# Patient Record
Sex: Female | Born: 1980 | Race: White | Hispanic: No | Marital: Single | Smoking: Former smoker
Health system: Southern US, Community
[De-identification: ages and names within clinical notes are randomized; demographics above are authoritative.]

## PROBLEM LIST (undated history)

## (undated) DIAGNOSIS — J45909 Unspecified asthma, uncomplicated: Secondary | ICD-10-CM

## (undated) DIAGNOSIS — T7840XA Allergy, unspecified, initial encounter: Secondary | ICD-10-CM

## (undated) DIAGNOSIS — I341 Nonrheumatic mitral (valve) prolapse: Secondary | ICD-10-CM

## (undated) HISTORY — DX: Unspecified asthma, uncomplicated: J45.909

## (undated) HISTORY — DX: Allergy, unspecified, initial encounter: T78.40XA

---
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
## 2005-05-19 ENCOUNTER — Other Ambulatory Visit: Admission: RE | Admit: 2005-05-19 | Discharge: 2005-05-19 | Payer: Self-pay | Admitting: Obstetrics and Gynecology

## 2006-11-21 ENCOUNTER — Inpatient Hospital Stay (HOSPITAL_COMMUNITY): Admission: AD | Admit: 2006-11-21 | Discharge: 2006-11-24 | Payer: Self-pay | Admitting: Obstetrics and Gynecology

## 2010-01-04 ENCOUNTER — Emergency Department (HOSPITAL_COMMUNITY)
Admission: EM | Admit: 2010-01-04 | Discharge: 2010-01-04 | Payer: Self-pay | Source: Home / Self Care | Admitting: Family Medicine

## 2010-04-07 LAB — POCT URINALYSIS DIPSTICK
Nitrite: NEGATIVE
Protein, ur: NEGATIVE mg/dL
Specific Gravity, Urine: 1.02 (ref 1.005–1.030)
Urobilinogen, UA: 1 mg/dL (ref 0.0–1.0)
pH: 6 (ref 5.0–8.0)

## 2010-04-07 LAB — COMPREHENSIVE METABOLIC PANEL
ALT: 11 U/L (ref 0–35)
AST: 18 U/L (ref 0–37)
Albumin: 3.6 g/dL (ref 3.5–5.2)
Alkaline Phosphatase: 46 U/L (ref 39–117)
BUN: 9 mg/dL (ref 6–23)
CO2: 25 mEq/L (ref 19–32)
Calcium: 9.4 mg/dL (ref 8.4–10.5)
Chloride: 109 mEq/L (ref 96–112)
Creatinine, Ser: 0.72 mg/dL (ref 0.4–1.2)
GFR calc Af Amer: >60 mL/min (ref >60)
GFR calc non Af Amer: >60 mL/min (ref >60)
Glucose, Bld: 114 mg/dL — ABNORMAL HIGH (ref 70–99)
Potassium: 3.5 mEq/L (ref 3.5–5.1)
Sodium: 141 mEq/L (ref 135–145)
Total Bilirubin: 1 mg/dL (ref 0.3–1.2)
Total Protein: 6.5 g/dL (ref 6.0–8.3)

## 2010-04-07 LAB — DIFFERENTIAL
Basophils Absolute: 0 10*3/uL (ref 0.0–0.1)
Basophils Relative: 0 % (ref 0–1)
Eosinophils Absolute: 0.3 10*3/uL (ref 0.0–0.7)
Eosinophils Relative: 6 % — ABNORMAL HIGH (ref 0–5)
Lymphocytes Relative: 24 % (ref 12–46)
Lymphs Abs: 1.4 10*3/uL (ref 0.7–4.0)
Monocytes Absolute: 0.5 10*3/uL (ref 0.1–1.0)
Monocytes Relative: 9 % (ref 3–12)
Neutro Abs: 3.5 10*3/uL (ref 1.7–7.7)
Neutrophils Relative %: 61 % (ref 43–77)

## 2010-04-07 LAB — CBC
HCT: 38.9 % (ref 36.0–46.0)
Hemoglobin: 12.7 g/dL (ref 12.0–15.0)
MCH: 29.6 pg (ref 26.0–34.0)
MCHC: 32.6 g/dL (ref 30.0–36.0)
MCV: 90.7 fL (ref 78.0–100.0)
Platelets: 264 10*3/uL (ref 150–400)
RBC: 4.29 MIL/uL (ref 3.87–5.11)
RDW: 12.3 % (ref 11.5–15.5)
WBC: 5.6 10*3/uL (ref 4.0–10.5)

## 2010-04-07 LAB — SEDIMENTATION RATE: Sed Rate: 6 mm/hr (ref 0–22)

## 2010-04-07 LAB — TSH: TSH: 1.385 u[IU]/mL (ref 0.350–4.500)

## 2010-05-10 ENCOUNTER — Inpatient Hospital Stay (INDEPENDENT_AMBULATORY_CARE_PROVIDER_SITE_OTHER)
Admission: RE | Admit: 2010-05-10 | Discharge: 2010-05-10 | Disposition: A | Discharge status: Home / Self Care | Payer: 59 | Source: Ambulatory Visit | Attending: Family Medicine | Admitting: Family Medicine

## 2010-05-10 DIAGNOSIS — S61209A Unspecified open wound of unspecified finger without damage to nail, initial encounter: Secondary | ICD-10-CM

## 2010-06-09 NOTE — H&P (Signed)
NAMEShauntee, [name]                   ACCOUNT NO.:  [id number]   MEDICAL RECORD NO.:  [id number]          PATIENT TYPE:  INP   LOCATION:  9166                          FACILITY:  [facility]   PHYSICIAN:  Lenoard Aden, M.D.DATE OF BIRTH:  07-07-1980   DATE OF ADMISSION:  11/21/2006  DATE OF DISCHARGE:                              HISTORY & PHYSICAL   CHIEF COMPLAINT:  Oligohydramnios.   HISTORY OF PRESENT ILLNESS:  She is a 30 year old white female G1, P0 at  [redacted] weeks gestation with an AFI of 4.5 today for induction.   ALLERGIES:  No known drug allergies.   MEDICATIONS:  Prenatal vitamins.   FAMILY HISTORY:  She has a family history of hypertension, heart  disease, anemia, kidney stones, cervical cancer, congenital anomalies.   PAST PERSONAL HISTORY:  She has a personal history of wisdom tooth  extraction, mitral valve prolapse without premedication, childhood  asthma.   SOCIAL HISTORY:  She is a reformed smoker.  She denies domestic or  physical violence.   OBSTETRICAL HISTORY:  Her pregnancy course to date has otherwise been  uncomplicated.   PHYSICAL EXAMINATION:  GENERAL APPEARANCE:  She is a well-developed,  well-nourished white female in no acute distress.  HEENT: Normal.  VITAL SIGNS:  Blood pressure 102/72.  Weight 145 pounds.  LUNGS:  Clear.  HEART:  Regular rate and rhythm.  ABDOMEN:  Soft, gravid, nontender.  Estimated fetal weight 7.5 pounds.  PELVIC:  Cervix is 2 cm, 70%, vertex, 0 station.  EXTREMITIES:  Reveal no cords.  NEUROLOGICAL:  Nonfocal.  SKIN:  Intact.   NST is reactive.  Cervidil is placed.   IMPRESSION:  1. 40 week OB.  2. Oligohydramnios for induction.   PLAN:  Proceed with cervical ripening induction, anticipate attempt at  vaginal delivery.      Lenoard Aden, M.D.  Electronically Signed     RJT/MEDQ  D:  11/21/2006  T:  11/22/2006  Job:  [id number]

## 2010-11-04 LAB — CBC
HCT: 32.8 — ABNORMAL LOW
MCHC: 34.6
MCV: 89.9
Platelets: 199
Platelets: 206
RBC: 3.92
RDW: 12.9
RDW: 13.2
WBC: 9.6

## 2010-11-04 LAB — RPR: RPR Ser Ql: NONREACTIVE

## 2010-12-11 ENCOUNTER — Emergency Department (HOSPITAL_COMMUNITY)
Admission: EM | Admit: 2010-12-11 | Discharge: 2010-12-11 | Disposition: A | Discharge status: Home / Self Care | Payer: Managed Care, Other (non HMO) | Attending: Emergency Medicine | Admitting: Emergency Medicine

## 2010-12-11 ENCOUNTER — Emergency Department (HOSPITAL_COMMUNITY): Payer: Managed Care, Other (non HMO)

## 2010-12-11 DIAGNOSIS — I059 Rheumatic mitral valve disease, unspecified: Secondary | ICD-10-CM | POA: Insufficient documentation

## 2010-12-11 DIAGNOSIS — N201 Calculus of ureter: Secondary | ICD-10-CM

## 2010-12-11 DIAGNOSIS — R109 Unspecified abdominal pain: Secondary | ICD-10-CM | POA: Insufficient documentation

## 2010-12-11 DIAGNOSIS — F17200 Nicotine dependence, unspecified, uncomplicated: Secondary | ICD-10-CM | POA: Insufficient documentation

## 2010-12-11 DIAGNOSIS — R110 Nausea: Secondary | ICD-10-CM | POA: Insufficient documentation

## 2010-12-11 HISTORY — DX: Nonrheumatic mitral (valve) prolapse: I34.1

## 2010-12-11 LAB — URINALYSIS, ROUTINE W REFLEX MICROSCOPIC
Bilirubin Urine: NEGATIVE
Leukocytes, UA: NEGATIVE
Nitrite: NEGATIVE
Specific Gravity, Urine: >1.03 — ABNORMAL HIGH (ref 1.005–1.030)
Urobilinogen, UA: 0.2 mg/dL (ref 0.0–1.0)
pH: 5.5 (ref 5.0–8.0)

## 2010-12-11 LAB — POCT I-STAT, CHEM 8
BUN: 9 mg/dL (ref 6–23)
Calcium, Ion: 1.18 mmol/L (ref 1.12–1.32)
Chloride: 103 mEq/L (ref 96–112)
Creatinine, Ser: 0.7 mg/dL (ref 0.50–1.10)
Sodium: 141 mEq/L (ref 135–145)

## 2010-12-11 LAB — URINE MICROSCOPIC-ADD ON

## 2010-12-11 MED ORDER — OXYCODONE-ACETAMINOPHEN 5-325 MG PO TABS: 1.0000 | ORAL_TABLET | Freq: Four times a day (QID) | ORAL | Status: AC | PRN

## 2010-12-11 MED ORDER — ONDANSETRON HCL 4 MG/2ML IJ SOLN
4.0000 mg | Freq: Once | INTRAMUSCULAR | Status: AC
  Administered 2010-12-11: 4 mg via INTRAVENOUS
  Filled 2010-12-11: qty 2

## 2010-12-11 MED ORDER — FENTANYL CITRATE 0.05 MG/ML IJ SOLN
50.0000 ug | Freq: Once | INTRAMUSCULAR | Status: AC
  Administered 2010-12-11: 50 ug via INTRAVENOUS
  Filled 2010-12-11: qty 2

## 2010-12-11 NOTE — ED Notes (Signed)
Pt given 2 warm blankets

## 2010-12-11 NOTE — ED Notes (Signed)
Patient presents with right flank pain with urinary frequency and urgency x3 days with intermittent vomiting.

## 2010-12-11 NOTE — ED Provider Notes (Signed)
History     CSN: [id number] Arrival date & time: 12/11/2010 10:51 AM   First MD Initiated Contact with Patient 12/11/10 1222      Chief Complaint  Patient presents with  . Flank Pain    (Consider location/radiation/quality/duration/timing/severity/associated sxs/prior treatment) HPI Presents with symptoms of urinary frequency as well as right-sided flank pain. She states that the symptoms began earlier today. The flank pain was acute in onset. It lasted several hours and was described as sharp and stabbing. At the time of my evaluation and the pain resolves. She denies any vaginal bleeding or vaginal discharge. She did feel nauseated but had no vomiting. She denies any fever or chills. She has not tried anything to make her symptoms better. There are no other associated symptoms.  Past Medical History  Diagnosis Date  . Mitral valve prolapse     History reviewed. No pertinent past surgical history.  History reviewed. No pertinent family history.  History  Substance Use Topics  . Smoking status: Current Everyday Smoker  . Smokeless tobacco: Not on file  . Alcohol Use: Yes    OB History    Grav Para Term Preterm Abortions TAB SAB Ect Mult Living                  Review of Systems ROS reviewed and otherwise negative except for mentioned in HPI  Allergies  Review of patient's allergies indicates no known allergies.  Home Medications   Current Outpatient Rx  Name Route Sig Dispense Refill  . ETONOGESTREL-ETHINYL ESTRADIOL 0.12-0.015 MG/24HR VA RING Vaginal Place 1 each vaginally every 28 (twenty-eight) days. Insert vaginally and leave in place for 3 consecutive weeks, then remove for 1 week.     . OXYCODONE-ACETAMINOPHEN 5-325 MG PO TABS Oral Take 1-2 tablets by mouth every 6 (six) hours as needed for pain. 15 tablet 0    BP 102/73  Pulse 90  Temp(Src) 97.5 F (36.4 C) (Oral)  Resp 16  Ht 5\' 7"  (1.702 m)  Wt 110 lb (49.896 kg)  BMI 17.23 kg/m2  SpO2 97%  LMP  11/30/2010 Vitals reviewed Physical Exam Physical Examination: General appearance - alert, well appearing, and in no distress Mental status - alert, oriented to person, place, and time Mouth - mucous membranes moist, pharynx normal without lesions Chest - clear to auscultation, no wheezes, rales or rhonchi, symmetric air entry Heart - normal rate, regular rhythm, normal S1, S2, no murmurs, rubs, clicks or gallops Abdomen - soft, nontender, nondistended, no masses or organomegaly Back exam - no CVA tenderness  Musculoskeletal - no joint tenderness, deformity or swelling Extremities - peripheral pulses normal, no pedal edema, no clubbing or cyanosis Skin - normal coloration and turgor, no rashes, no suspicious skin lesions noted  ED Course  Procedures (including critical care time)  Labs Reviewed  URINALYSIS, ROUTINE W REFLEX MICROSCOPIC - Abnormal; Notable for the following:    Appearance CLOUDY (*)    Specific Gravity, Urine >1.030 (*)    Hgb urine dipstick LARGE (*)    All other components within normal limits  URINE MICROSCOPIC-ADD ON - Abnormal; Notable for the following:    Squamous Epithelial / LPF MANY (*)    Bacteria, UA FEW (*)    All other components within normal limits  POCT I-STAT, CHEM 8 - Abnormal; Notable for the following:    Hemoglobin 15.3 (*)    All other components within normal limits  POCT PREGNANCY, URINE  POCT PREGNANCY, URINE  URINALYSIS, ROUTINE  W REFLEX MICROSCOPIC  PREGNANCY, URINE  I-STAT, CHEM 8   Ct Abdomen Pelvis Wo Contrast  12/11/2010  *RADIOLOGY REPORT*  Clinical Data: Right flank pain.  Hematuria.  CT ABDOMEN AND PELVIS WITHOUT CONTRAST 12/11/2010:  Technique:  Multidetector CT imaging of the abdomen and pelvis was performed following the standard protocol without intravenous contrast.  Comparison: None.  Findings: Approximate 3 mm calcification in the low   right side of the pelvis which I believe is in the distal right ureter.  Mild  hydronephrosis involving the right kidney relative to the left.  Non-obstructing approximate 3 mm calculus in an upper pole calix of the left kidney.  Within the limits of the unenhanced technique, no focal parenchymal abnormality involving either kidney.  Anatomic variants involving the liver in that there is a prominent Reidel lobe and the left lobe extends well across the midline into the left upper quadrant.  Borderline hepatomegaly.  Normal unenhanced appearance of the spleen, pancreas, and adrenal glands. Gallbladder unremarkable by CT.  No biliary ductal dilation.  No visible atherosclerosis.  No significant lymphadenopathy.  Stomach decompressed and unremarkable by CT.  Normal-appearing small bowel.  Large amount of stool in the rectum, sigmoid colon, and cecum; no focal abnormality involving the colon.  Appendix normal in caliber, containing 2 adjacent small appendicoliths distally (series 2, image 43).  No ascites.  Normal unenhanced appearance of the uterus and ovaries.  Small bilateral ovarian cysts consistent with age.  No dominant cysts. No free pelvic fluid.  Urinary bladder unremarkable.  Bone window images unremarkable.  Visualized lung bases clear.  Heart size normal.  IMPRESSION:  1.  3 mm calculus in the distal right ureter near the UVJ, causing mild right urinary tract obstruction. 2.  Non-obstructing approximate 4 mm calculus in the upper pole calix of the left kidney. 3.  Borderline hepatomegaly.  Original Report Authenticated By: Arnell Sieving, M.D.     1. Right ureteral stone       MDM  Patient presented with right-sided flank pain as well as urinary frequency. Her urine was positive for blood. Her CT scan showed a 3 mm stone at BU feet J. Her pain was not present at the time of my evaluation however she did have recurrence of flank pain during her ED stay. This was treated with fentanyl and is now much improved. She was discharged with strict return precautions and advised to  followup with urology. She is agreeable with this plan        Ethelda Chick, MD 12/11/10 [phone]

## 2012-06-14 ENCOUNTER — Ambulatory Visit (INDEPENDENT_AMBULATORY_CARE_PROVIDER_SITE_OTHER): Payer: Managed Care, Other (non HMO) | Admitting: Family Medicine

## 2012-06-14 VITALS — BP 150/90 | HR 116 | Temp 99.3°F | Resp 16 | Ht 66.5 in | Wt 114.0 lb

## 2012-06-14 DIAGNOSIS — R05 Cough: Secondary | ICD-10-CM

## 2012-06-14 DIAGNOSIS — J209 Acute bronchitis, unspecified: Secondary | ICD-10-CM

## 2012-06-14 DIAGNOSIS — R0789 Other chest pain: Secondary | ICD-10-CM

## 2012-06-14 DIAGNOSIS — R059 Cough, unspecified: Secondary | ICD-10-CM

## 2012-06-14 DIAGNOSIS — R071 Chest pain on breathing: Secondary | ICD-10-CM

## 2012-06-14 MED ORDER — AZITHROMYCIN 250 MG PO TABS: ORAL_TABLET | ORAL | Status: DC

## 2012-06-14 MED ORDER — HYDROCODONE-HOMATROPINE 5-1.5 MG/5ML PO SYRP: ORAL_SOLUTION | ORAL | Status: AC

## 2012-06-14 NOTE — Patient Instructions (Signed)
Start the antibiotic, mucinex DM or prescribed cough syrup if needed. Alleve over the counter for sore muscles with cough. Keep a record of your blood pressures outside of the office and recheck if remain above 140/90. Return to the clinic or go to the nearest emergency room if any of your symptoms worsen or new symptoms occur. Bronchitis Bronchitis is the body's way of reacting to injury and/or infection (inflammation) of the bronchi. Bronchi are the air tubes that extend from the windpipe into the lungs. If the inflammation becomes severe, it may cause shortness of breath. CAUSES  Inflammation may be caused by:  A virus.  Germs (bacteria).  Dust.  Allergens.  Pollutants and many other irritants. The cells lining the bronchial tree are covered with tiny hairs (cilia). These constantly beat upward, away from the lungs, toward the mouth. This keeps the lungs free of pollutants. When these cells become too irritated and are unable to do their job, mucus begins to develop. This causes the characteristic cough of bronchitis. The cough clears the lungs when the cilia are unable to do their job. Without either of these protective mechanisms, the mucus would settle in the lungs. Then you would develop pneumonia. Smoking is a common cause of bronchitis and can contribute to pneumonia. Stopping this habit is the single most important thing you can do to help yourself. TREATMENT   Your caregiver may prescribe an antibiotic if the cough is caused by bacteria. Also, medicines that open up your airways make it easier to breathe. Your caregiver may also recommend or prescribe an expectorant. It will loosen the mucus to be coughed up. Only take over-the-counter or prescription medicines for pain, discomfort, or fever as directed by your caregiver.  Removing whatever causes the problem (smoking, for example) is critical to preventing the problem from getting worse.  Cough suppressants may be prescribed for  relief of cough symptoms.  Inhaled medicines may be prescribed to help with symptoms now and to help prevent problems from returning.  For those with recurrent (chronic) bronchitis, there may be a need for steroid medicines. SEEK IMMEDIATE MEDICAL CARE IF:   During treatment, you develop more pus-like mucus (purulent sputum).  You have a fever.  Your baby is older than 3 months with a rectal temperature of 102 F (38.9 C) or higher.  Your baby is 11 months old or younger with a rectal temperature of 100.4 F (38 C) or higher.  You become progressively more ill.  You have increased difficulty breathing, wheezing, or shortness of breath. It is necessary to seek immediate medical care if you are elderly or sick from any other disease. MAKE SURE YOU:   Understand these instructions.  Will watch your condition.  Will get help right away if you are not doing well or get worse. Document Released: 01/11/2005 Document Revised: 04/05/2011 Document Reviewed: 11/21/2007 [facility] Patient Information 2014 [place], [place].   Cough, Adult  A cough is a reflex that helps clear your throat and airways. It can help heal the body or may be a reaction to an irritated airway. A cough may only last 2 or 3 weeks (acute) or may last more than 8 weeks (chronic).  CAUSES Acute cough:  Viral or bacterial infections. Chronic cough:  Infections.  Allergies.  Asthma.  Post-nasal drip.  Smoking.  Heartburn or acid reflux.  Some medicines.  Chronic lung problems (COPD).  Cancer. SYMPTOMS   Cough.  Fever.  Chest pain.  Increased breathing rate.  High-pitched whistling  sound when breathing (wheezing).  Colored mucus that you cough up (sputum). TREATMENT   A bacterial cough may be treated with antibiotic medicine.  A viral cough must run its course and will not respond to antibiotics.  Your caregiver may recommend other treatments if you have a chronic cough. HOME CARE  INSTRUCTIONS   Only take over-the-counter or prescription medicines for pain, discomfort, or fever as directed by your caregiver. Use cough suppressants only as directed by your caregiver.  Use a cold steam vaporizer or humidifier in your bedroom or home to help loosen secretions.  Sleep in a semi-upright position if your cough is worse at night.  Rest as needed.  Stop smoking if you smoke. SEEK IMMEDIATE MEDICAL CARE IF:   You have pus in your sputum.  Your cough starts to worsen.  You cannot control your cough with suppressants and are losing sleep.  You begin coughing up blood.  You have difficulty breathing.  You develop pain which is getting worse or is uncontrolled with medicine.  You have a fever. MAKE SURE YOU:   Understand these instructions.  Will watch your condition.  Will get help right away if you are not doing well or get worse. Document Released: 07/10/2010 Document Revised: 04/05/2011 Document Reviewed: 07/10/2010 [facility] Patient Information 2014 [place], [place].

## 2012-06-14 NOTE — Progress Notes (Signed)
Subjective:    Patient ID: [name], female    DOB: 12/12/1980, 32 y.o.   MRN: [id number]  HPI [name] is a 32 y.o. female Cold symptoms for about 3 weeks, continued cough - with productive cough - brown - greenish sputum, also with initial congestion in face - improved after 2 days. Hx of allergies, asthma as child - no inhaler since elementary school. Subjective hot and cold, but has been taking ibuprofen. Not dyspneic with coughing spells.   No known sick contacts, but went on cruise April 28-May 2nd, started with cough/congestion then.   Account analyst - desk job.   Tx: mucinex DM, nasacort, claritin, ibuprofen, water,     Review of Systems  Constitutional: Positive for fever (subjective only).  HENT: Positive for congestion (prior ). Negative for sinus pressure.   Respiratory: Positive for cough and shortness of breath (with coughing fits. ). Negative for wheezing.   Musculoskeletal: Positive for back pain (mid L back with cough. ).  Skin: Negative for rash.       Objective:   Physical Exam  Vitals reviewed. Constitutional: She is oriented to person, place, and time. She appears well-developed and well-nourished. No distress.  HENT:  Head: Normocephalic and atraumatic.  Right Ear: Hearing, tympanic membrane, external ear and ear canal normal.  Left Ear: Hearing, tympanic membrane, external ear and ear canal normal.  Nose: Nose normal.  Mouth/Throat: Oropharynx is clear and moist. No oropharyngeal exudate.  Eyes: Conjunctivae and EOM are normal. Pupils are equal, round, and reactive to light.  Cardiovascular: Normal rate, regular rhythm, normal heart sounds and intact distal pulses.   No murmur heard. Pulmonary/Chest: Effort normal and breath sounds normal. No respiratory distress. She has no decreased breath sounds. She has no wheezes. She has no rhonchi. She has no rales.    Clear, normal effort. No wheeze.   Abdominal: Bowel sounds are normal.  Neurological: She is  alert and oriented to person, place, and time.  Skin: Skin is warm and dry. No rash noted.  Psychiatric: She has a normal mood and affect. Her behavior is normal.        Assessment & Plan:  [name] is a 32 y.o. female Acute bronchitis - Plan: azithromycin (ZITHROMAX) 250 MG tablet  Chest wall pain  Cough - Plan: HYDROcodone-homatropine (HYCODAN) 5-1.5 MG/5ML syrup  Suspected intial URI with secondary bronchitis.  Lungs clear and afebrile. Discussed cxr, cbc, but deferred tonight. Start z pak, cont mucinex DM, hycodan QHS to Q6h prn, and rtc precautions - if not improving in few days, consider cxr.   Chest wall pain, intercostal strain/myalgia with cough.  Sx care with alleve, rtc precautions.   Meds ordered this encounter                         . azithromycin (ZITHROMAX) 250 MG tablet    Sig: Take 2 pills by mouth on day 1, then 1 pill by mouth per day on days 2 through 5.    Dispense:  6 each    Refill:  0  . HYDROcodone-homatropine (HYCODAN) 5-1.5 MG/5ML syrup    Sig: 78m by mouth at bedtime as needed for cough.    Dispense:  120 mL    Refill:  0   Patient Instructions  Start the antibiotic, mucinex DM or prescribed cough syrup if needed. Alleve over the counter for sore muscles with cough. Keep a record of your blood pressures outside of  the office and recheck if remain above 140/90. Return to the clinic or go to the nearest emergency room if any of your symptoms worsen or new symptoms occur. Bronchitis Bronchitis is the body's way of reacting to injury and/or infection (inflammation) of the bronchi. Bronchi are the air tubes that extend from the windpipe into the lungs. If the inflammation becomes severe, it may cause shortness of breath. CAUSES  Inflammation may be caused by:  A virus.  Germs (bacteria).  Dust.  Allergens.  Pollutants and many other irritants. The cells lining the bronchial tree are covered with tiny hairs (cilia). These constantly beat  upward, away from the lungs, toward the mouth. This keeps the lungs free of pollutants. When these cells become too irritated and are unable to do their job, mucus begins to develop. This causes the characteristic cough of bronchitis. The cough clears the lungs when the cilia are unable to do their job. Without either of these protective mechanisms, the mucus would settle in the lungs. Then you would develop pneumonia. Smoking is a common cause of bronchitis and can contribute to pneumonia. Stopping this habit is the single most important thing you can do to help yourself. TREATMENT   Your caregiver may prescribe an antibiotic if the cough is caused by bacteria. Also, medicines that open up your airways make it easier to breathe. Your caregiver may also recommend or prescribe an expectorant. It will loosen the mucus to be coughed up. Only take over-the-counter or prescription medicines for pain, discomfort, or fever as directed by your caregiver.  Removing whatever causes the problem (smoking, for example) is critical to preventing the problem from getting worse.  Cough suppressants may be prescribed for relief of cough symptoms.  Inhaled medicines may be prescribed to help with symptoms now and to help prevent problems from returning.  For those with recurrent (chronic) bronchitis, there may be a need for steroid medicines. SEEK IMMEDIATE MEDICAL CARE IF:   During treatment, you develop more pus-like mucus (purulent sputum).  You have a fever.  Your baby is older than 3 months with a rectal temperature of 102 F (38.9 C) or higher.  Your baby is 42 months old or younger with a rectal temperature of 100.4 F (38 C) or higher.  You become progressively more ill.  You have increased difficulty breathing, wheezing, or shortness of breath. It is necessary to seek immediate medical care if you are elderly or sick from any other disease. MAKE SURE YOU:   Understand these instructions.  Will  watch your condition.  Will get help right away if you are not doing well or get worse. Document Released: 01/11/2005 Document Revised: 04/05/2011 Document Reviewed: 11/21/2007 [facility] Patient Information 2014 [place], [place].   Cough, Adult  A cough is a reflex that helps clear your throat and airways. It can help heal the body or may be a reaction to an irritated airway. A cough may only last 2 or 3 weeks (acute) or may last more than 8 weeks (chronic).  CAUSES Acute cough:  Viral or bacterial infections. Chronic cough:  Infections.  Allergies.  Asthma.  Post-nasal drip.  Smoking.  Heartburn or acid reflux.  Some medicines.  Chronic lung problems (COPD).  Cancer. SYMPTOMS   Cough.  Fever.  Chest pain.  Increased breathing rate.  High-pitched whistling sound when breathing (wheezing).  Colored mucus that you cough up (sputum). TREATMENT   A bacterial cough may be treated with antibiotic medicine.  A viral cough  must run its course and will not respond to antibiotics.  Your caregiver may recommend other treatments if you have a chronic cough. HOME CARE INSTRUCTIONS   Only take over-the-counter or prescription medicines for pain, discomfort, or fever as directed by your caregiver. Use cough suppressants only as directed by your caregiver.  Use a cold steam vaporizer or humidifier in your bedroom or home to help loosen secretions.  Sleep in a semi-upright position if your cough is worse at night.  Rest as needed.  Stop smoking if you smoke. SEEK IMMEDIATE MEDICAL CARE IF:   You have pus in your sputum.  Your cough starts to worsen.  You cannot control your cough with suppressants and are losing sleep.  You begin coughing up blood.  You have difficulty breathing.  You develop pain which is getting worse or is uncontrolled with medicine.  You have a fever. MAKE SURE YOU:   Understand these instructions.  Will watch your condition.  Will  get help right away if you are not doing well or get worse. Document Released: 07/10/2010 Document Revised: 04/05/2011 Document Reviewed: 07/10/2010 [facility] Patient Information 2014 [place], [place].

## 2012-06-15 ENCOUNTER — Ambulatory Visit (INDEPENDENT_AMBULATORY_CARE_PROVIDER_SITE_OTHER): Payer: Managed Care, Other (non HMO) | Admitting: Family Medicine

## 2012-06-15 ENCOUNTER — Ambulatory Visit: Payer: Managed Care, Other (non HMO)

## 2012-06-15 ENCOUNTER — Telehealth: Payer: Self-pay

## 2012-06-15 VITALS — BP 147/92 | HR 130 | Temp 98.5°F | Resp 16 | Ht 66.5 in | Wt 113.8 lb

## 2012-06-15 DIAGNOSIS — R0609 Other forms of dyspnea: Secondary | ICD-10-CM

## 2012-06-15 DIAGNOSIS — R0600 Dyspnea, unspecified: Secondary | ICD-10-CM

## 2012-06-15 DIAGNOSIS — R0789 Other chest pain: Secondary | ICD-10-CM

## 2012-06-15 DIAGNOSIS — R05 Cough: Secondary | ICD-10-CM

## 2012-06-15 DIAGNOSIS — R071 Chest pain on breathing: Secondary | ICD-10-CM

## 2012-06-15 DIAGNOSIS — J189 Pneumonia, unspecified organism: Secondary | ICD-10-CM

## 2012-06-15 DIAGNOSIS — R059 Cough, unspecified: Secondary | ICD-10-CM

## 2012-06-15 DIAGNOSIS — R0989 Other specified symptoms and signs involving the circulatory and respiratory systems: Secondary | ICD-10-CM

## 2012-06-15 LAB — POCT CBC
Lymph, poc: 1.5 (ref 0.6–3.4)
MCH, POC: 30.6 pg (ref 27–31.2)
MCHC: 32 g/dL (ref 31.8–35.4)
MID (cbc): 0.9 (ref 0–0.9)
MPV: 8.3 fL (ref 0–99.8)
POC Granulocyte: 10.4 — AB (ref 2–6.9)
POC MID %: 6.7 %M (ref 0–12)
Platelet Count, POC: 357 10*3/uL (ref 142–424)
WBC: 12.7 10*3/uL — AB (ref 4.6–10.2)

## 2012-06-15 NOTE — Telephone Encounter (Signed)
Called pt at mobile number - left message to call back. i am happy to see her tonight for x rays, including rib series if she would like.  I did prescribe the hydrocodone (we talked about pain meds for the area involved last night) and I can prescribe something else if needed.  Instructed to call back with further info.

## 2012-06-15 NOTE — Telephone Encounter (Signed)
Called pt - didn't take ibuprofen yesterday afternoon, more intense pain in left side lungs last night when got home, friend who is a nurse listened to her and thought she may have pleurisy?  Less sore today while taking ibuprofen, but still sore in area.  Has started other meds.  Recommend recheck for CXR and possible rib series xray. She plans on coming in tonight.

## 2012-06-15 NOTE — Patient Instructions (Addendum)
Continue the antibiotic as prescribed last night. Ok to continue ibuprofen for pain, but can take hydrocodone cough syrup - 1/2 to 1 teaspoon every 6 hours if needed for both cough or pain.  Recheck in next 2-3 days if not improving - sooner if worse.

## 2012-06-15 NOTE — Telephone Encounter (Signed)
Called her, she is requesting an xray for her rib pain, I advised her she can certainly come back in for this, advised her to give the medications time to work. If she worsens , she should come in immediately. She states a friend who is a nurse told her she should have had xrays. To you FYI

## 2012-06-15 NOTE — Progress Notes (Signed)
Subjective:    Patient ID: [name], female    DOB: 1980/05/29, 32 y.o.   MRN: [id number]  HPI [name] is a 32 y.o. female  See ov yesterday and phone call earlier today.  Worse pain last night in back  L rib area. Noted pain into back of L shoulder. Hurt with every breath. Had not taken ibuprofen since 2 in the afternoon. Took total of 800mg  of ibuprofen., then hydrocodone syrup around 9:45 - pain subsided.  No fever, slight sweating at times. Pain in back on left 3 days ago. Does not remember one specific injury with pain.  Uncomfortable, but not as sore as yesterday. Not short of breath at rest, but more fatigued with walking. Able to work today on ibuprofen.   Tx: as above. Ibuprofen today 600mg  Q6h today.   Review of Systems  Constitutional: Positive for diaphoresis. Negative for fever and chills.  Respiratory: Positive for cough, chest tightness (yesterday,  with cough - not today. ) and shortness of breath.   Cardiovascular: Negative for chest pain and leg swelling.  Skin: Negative for rash and wound.      Objective:   Physical Exam  Vitals reviewed. Constitutional: She is oriented to person, place, and time. She appears well-developed and well-nourished. No distress.  HENT:  Head: Normocephalic and atraumatic.  Eyes: Conjunctivae and EOM are normal. Pupils are equal, round, and reactive to light.  Neck: Normal range of motion.  Pulmonary/Chest: Effort normal. No accessory muscle usage. Not tachypneic. No respiratory distress. She has no decreased breath sounds. She has no wheezes. She has rhonchi (few distant rhonhi - lower lobes - cleared with cough. ). She has no rales.  Lymphadenopathy:    She has no cervical adenopathy.  Neurological: She is alert and oriented to person, place, and time.  Skin: Skin is warm and dry. She is not diaphoretic.  Psychiatric: She has a normal mood and affect. Her behavior is normal.      Results for orders placed in visit on 06/15/12   POCT CBC      Result Value Range   WBC 12.7 (*) 4.6 - 10.2 K/uL   Lymph, poc 1.5  0.6 - 3.4   POC LYMPH PERCENT 11.6  10 - 50 %L   MID (cbc) 0.9  0 - 0.9   POC MID % 6.7  0 - 12 %M   POC Granulocyte 10.4 (*) 2 - 6.9   Granulocyte percent 81.7 (*) 37 - 80 %G   RBC 3.99 (*) 4.04 - 5.48 M/uL   Hemoglobin 12.2  12.2 - 16.2 g/dL   HCT, POC 32.4  40.1 - 47.9 %   MCV 95.6  80 - 97 fL   MCH, POC 30.6  27 - 31.2 pg   MCHC 32.0  31.8 - 35.4 g/dL   RDW, POC 02.7     Platelet Count, POC 357  142 - 424 K/uL   MPV 8.3  0 - 99.8 fL   [facility] reading (PRIMARY) by  Dr. Neva Seat: CXR/rib series - no apparent fracture, increased LLL markings/infiltrate.       Assessment & Plan:  [name] is a 32 y.o. female Left-sided chest wall pain - Plan: DG Ribs Unilateral W/Chest Left, DG Chest 1 View  Cough - Plan: DG Ribs Unilateral W/Chest Left, DG Chest 1 View  Dyspnea - Plan: DG Ribs Unilateral W/Chest Left, DG Chest 1 View  Pneumonia - Plan: POCT CBC  LLL Pneumonia with slight leukocytosis,  afebrile. Possible LLL pna as cause/contributor to pain, vs intercostal pain with cough. Symptomatically improved today, afebrile and O2 sat wnl. Will continue zpak, xr to overread.  Ok to take hycodan - 1/2 to 1 tsp Q6h prn, and recheck in 48-72 hrs if not significantly improved. rtc precautions given.   Patient Instructions  Continue the antibiotic as prescribed last night. Ok to continue ibuprofen for pain, but can take hydrocodone cough syrup - 1/2 to 1 teaspoon every 6 hours if needed for both cough or pain.  Recheck in next 2-3 days if not improving - sooner if worse.      Addendum: XR report noted. LLL pna with possible small pleural effusion.  Will call tomorrow to check status and discuss treatment/follow up.  Likely change to quinolone. No rib fractures noted.

## 2012-06-15 NOTE — Telephone Encounter (Signed)
Patient saw Dr. Neva Seat yesterday and has questions concerning her back and cold.   [phone]

## 2012-06-16 ENCOUNTER — Other Ambulatory Visit: Payer: Self-pay | Admitting: Family Medicine

## 2012-06-16 DIAGNOSIS — J90 Pleural effusion, not elsewhere classified: Secondary | ICD-10-CM

## 2012-06-16 DIAGNOSIS — J189 Pneumonia, unspecified organism: Secondary | ICD-10-CM

## 2012-06-16 MED ORDER — LEVOFLOXACIN 500 MG PO TABS: 500.0000 mg | ORAL_TABLET | Freq: Every day | ORAL | Status: DC

## 2012-06-17 ENCOUNTER — Encounter (HOSPITAL_COMMUNITY): Payer: Self-pay

## 2012-06-17 ENCOUNTER — Ambulatory Visit (INDEPENDENT_AMBULATORY_CARE_PROVIDER_SITE_OTHER): Payer: Managed Care, Other (non HMO) | Admitting: Emergency Medicine

## 2012-06-17 ENCOUNTER — Ambulatory Visit: Payer: Managed Care, Other (non HMO)

## 2012-06-17 ENCOUNTER — Ambulatory Visit (HOSPITAL_COMMUNITY)
Admission: RE | Admit: 2012-06-17 | Discharge: 2012-06-17 | Disposition: A | Discharge status: Home / Self Care | Payer: Managed Care, Other (non HMO) | Source: Ambulatory Visit | Attending: Emergency Medicine | Admitting: Emergency Medicine

## 2012-06-17 VITALS — BP 115/80 | HR 106 | Temp 98.3°F | Resp 17 | Ht 66.5 in | Wt 115.0 lb

## 2012-06-17 DIAGNOSIS — R918 Other nonspecific abnormal finding of lung field: Secondary | ICD-10-CM

## 2012-06-17 DIAGNOSIS — R911 Solitary pulmonary nodule: Secondary | ICD-10-CM | POA: Insufficient documentation

## 2012-06-17 DIAGNOSIS — M25519 Pain in unspecified shoulder: Secondary | ICD-10-CM | POA: Insufficient documentation

## 2012-06-17 DIAGNOSIS — R079 Chest pain, unspecified: Secondary | ICD-10-CM

## 2012-06-17 DIAGNOSIS — R0602 Shortness of breath: Secondary | ICD-10-CM | POA: Insufficient documentation

## 2012-06-17 LAB — POCT SEDIMENTATION RATE: POCT SED RATE: 70 mm/hr — AB (ref 0–22)

## 2012-06-17 LAB — POCT CBC
Granulocyte percent: 81.4 %G — AB (ref 37–80)
HCT, POC: 41 % (ref 37.7–47.9)
Hemoglobin: 13.3 g/dL (ref 12.2–16.2)
POC Granulocyte: 7.9 — AB (ref 2–6.9)
RBC: 4.32 M/uL (ref 4.04–5.48)
RDW, POC: 12.7 %

## 2012-06-17 MED ORDER — IOHEXOL 350 MG/ML SOLN
100.0000 mL | Freq: Once | INTRAVENOUS | Status: AC | PRN
  Administered 2012-06-17: 100 mL via INTRAVENOUS

## 2012-06-17 NOTE — Progress Notes (Signed)
Subjective:    Patient ID: [name], female    DOB: 1980-04-30, 32 y.o.   MRN: [id number]  HPI  32 YO female patient comes in today with a history of having an illness with head congestion cough which over time did not improve. Because of her persistent symptoms she presented to the office for evaluation. She was initially treated with a Z-Pak. The following day chest x-ray showed a infiltrate in the left base with what is felt to be a parapneumonic effusion. She was changed to Levaquin at that time and now enters today for reevaluation..  . She is  here to follow up. Feels weak, and fatigued. She does not have a productive cough. She continues to have left sided pain with exertion. The patient does have a history of a recent cruise to the [place]. She has a deep cough which sounds per day but she has not been able to cough up any phlegm. When she lays down she has more cough and feels a sensation in her chest which is uncomfortable but not really painful. Today she has not had any pain when she takes a deep breath. When she turns or rolls over in bed this triggers her cough. She does feel fatigued when she does any type of exertion  Review of Systems     Objective:   Physical Exam patient is alert and cooperative and does not appear in any distress. Her neck is supple there's no adenopathy. Breath sounds are symmetrical. There are fain rales left anterior chest. Does not appear to be any dullness. Breath sounds were symmetrical. cardiac rhythm is regular rate no murmurs. abdomen soft. Calf exam reveals no tenderness and no swelling.  [facility] reading (PRIMARY) by  Dr.Daub infiltrate present left base with questionable fluid versus consolidation please,comment  Results for orders placed in visit on 06/15/12  POCT CBC      Result Value Range   WBC 12.7 (*) 4.6 - 10.2 K/uL   Lymph, poc 1.5  0.6 - 3.4   POC LYMPH PERCENT 11.6  10 - 50 %L   MID (cbc) 0.9  0 - 0.9   POC MID % 6.7  0 - 12 %M   POC  Granulocyte 10.4 (*) 2 - 6.9   Granulocyte percent 81.7 (*) 37 - 80 %G   RBC 3.99 (*) 4.04 - 5.48 M/uL   Hemoglobin 12.2  12.2 - 16.2 g/dL   HCT, POC 40.9  81.1 - 47.9 %   MCV 95.6  80 - 97 fL   MCH, POC 30.6  27 - 31.2 pg   MCHC 32.0  31.8 - 35.4 g/dL   RDW, POC 91.4     Platelet Count, POC 357  142 - 424 K/uL   MPV 8.3  0 - 99.8 fL   Results for orders placed in visit on 06/17/12  POCT CBC      Result Value Range   WBC 9.7  4.6 - 10.2 K/uL   Lymph, poc 1.2  0.6 - 3.4   POC LYMPH PERCENT 12.5  10 - 50 %L   MID (cbc) 0.6  0 - 0.9   POC MID % 6.1  0 - 12 %M   POC Granulocyte 7.9 (*) 2 - 6.9   Granulocyte percent 81.4 (*) 37 - 80 %G   RBC 4.32  4.04 - 5.48 M/uL   Hemoglobin 13.3  12.2 - 16.2 g/dL   HCT, POC 78.2  95.6 - 47.9 %  MCV 95.0  80 - 97 fL   MCH, POC 30.8  27 - 31.2 pg   MCHC 32.4  31.8 - 35.4 g/dL   RDW, POC 16.1     Platelet Count, POC 425 (*) 142 - 424 K/uL   MPV 8.2  0 - 99.8 fL       Assessment & Plan:  Have asked for a stat reading from  Radiologist. CBC is improved. We'll do a stat d-dimer. Radiologist says the report shows the chest x-ray is unchanged with infiltrate left lower lobe with parapneumonic effusion. Her d-dimer has returned elevated. Will proceed with CT chest. If positive will treat with Xarelto. Patient does have a sister with von Willebrand's.

## 2012-06-17 NOTE — Patient Instructions (Addendum)
Pneumonia, Adult °Pneumonia is an infection of the lungs.  °CAUSES °Pneumonia may be caused by bacteria or a virus. Usually, these infections are caused by breathing infectious particles into the lungs (respiratory tract). °SYMPTOMS  °· Cough. °· Fever. °· Chest pain. °· Increased rate of breathing. °· Wheezing. °· Mucus production. °DIAGNOSIS  °If you have the common symptoms of pneumonia, your caregiver will typically confirm the diagnosis with a chest X-ray. The X-ray will show an abnormality in the lung (pulmonary infiltrate) if you have pneumonia. Other tests of your blood, urine, or sputum may be done to find the specific cause of your pneumonia. Your caregiver may also do tests (blood gases or pulse oximetry) to see how well your lungs are working. °TREATMENT  °Some forms of pneumonia may be spread to other people when you cough or sneeze. You may be asked to wear a mask before and during your exam. Pneumonia that is caused by bacteria is treated with antibiotic medicine. Pneumonia that is caused by the influenza virus may be treated with an antiviral medicine. Most other viral infections must run their course. These infections will not respond to antibiotics.  °PREVENTION °A pneumococcal shot (vaccine) is available to prevent a common bacterial cause of pneumonia. This is usually suggested for: °· People over 65 years old. °· Patients on chemotherapy. °· People with chronic lung problems, such as bronchitis or emphysema. °· People with immune system problems. °If you are over 65 or have a high risk condition, you may receive the pneumococcal vaccine if you have not received it before. In some countries, a routine influenza vaccine is also recommended. This vaccine can help prevent some cases of pneumonia. You may be offered the influenza vaccine as part of your care. °If you smoke, it is time to quit. You may receive instructions on how to stop smoking. Your caregiver can provide medicines and counseling to  help you quit. °HOME CARE INSTRUCTIONS  °· Cough suppressants may be used if you are losing too much rest. However, coughing protects you by clearing your lungs. You should avoid using cough suppressants if you can. °· Your caregiver may have prescribed medicine if he or she thinks your pneumonia is caused by a bacteria or influenza. Finish your medicine even if you start to feel better. °· Your caregiver may also prescribe an expectorant. This loosens the mucus to be coughed up. °· Only take over-the-counter or prescription medicines for pain, discomfort, or fever as directed by your caregiver. °· Do not smoke. Smoking is a common cause of bronchitis and can contribute to pneumonia. If you are a smoker and continue to smoke, your cough may last several weeks after your pneumonia has cleared. °· A cold steam vaporizer or humidifier in your room or home may help loosen mucus. °· Coughing is often worse at night. Sleeping in a semi-upright position in a recliner or using a couple pillows under your head will help with this. °· Get rest as you feel it is needed. Your body will usually let you know when you need to rest. °SEEK IMMEDIATE MEDICAL CARE IF:  °· Your illness becomes worse. This is especially true if you are elderly or weakened from any other disease. °· You cannot control your cough with suppressants and are losing sleep. °· You begin coughing up blood. °· You develop pain which is getting worse or is uncontrolled with medicines. °· You have a fever. °· Any of the symptoms which initially brought you in for treatment   are getting worse rather than better.  You develop shortness of breath or chest pain. MAKE SURE YOU:   Understand these instructions.  Will watch your condition.  Will get help right away if you are not doing well or get worse. Document Released: 01/11/2005 Document Revised: 04/05/2011 Document Reviewed: 04/02/2010 [facility] Patient Information 2014 [place], [place]. Pleural  Effusion The lining covering your lungs and the inside of your chest is called the pleura. Usually, the space between the 2 pleura contains no air and only a thin layer of fluid. A pleural effusion is an abnormal buildup of fluid in the pleural space. Fluid gathers when there is increased pressure in the lung vessels. This forces fluids out of the lungs and into the pleural space. Vessels may also leak fluids when there are infections, such as pneumonia, or other causes of soreness and redness (inflammation). Fluids leak into the lungs when protein in the blood is low or when certain vessels (lymphatics) are blocked. Finding a pleural effusion is important because it is usually caused by another disease. In order to treat a pleural effusion, your caregiver needs to find its cause. If left untreated, a large amount of fluid can build up and cause collapse of the lung. CAUSES   Heart failure.  Infections (pneumonia, tuberculosis), pulmonary embolism, pulmonary infarction.  Cancer (primary lung and metastatic), asbestosis.  Liver failure (cirrhosis).  Nephrotic syndrome, peritoneal dialysis, kidney problems (uremia).  Collagen vascular disease (systemic lupus erythematosis, rheumatoid arthritis).  Injury (trauma) to the chest or rupture of the digestive tube (esophagus).  Material in the chest or pleural space (hemothorax, chylothorax).  Pancreatitis.  Surgery.  Drug reactions. SYMPTOMS  A pleural effusion can decrease the amount of space available for breathing and make you short of breath. The fluid can become infected, which may cause pain and fever. Often, the pain is worse when taking a deep breath. The underlying disease (heart failure, pneumonia, blood clot, tuberculosis, cancer) may also cause symptoms. DIAGNOSIS   Your caregiver can usually tell what is wrong by talking to you (taking a history), doing an exam, and taking a routine X-ray. If the X-ray shows fluid in your chest,  often fluid is removed from your chest with a needle for testing (diagnostic thoracentesis).  Sometimes, more specialized X-rays may be needed.  Sometimes, a small piece of tissue is removed and examined by a specialist (biopsy). TREATMENT  Treatment varies based on what caused the pleural effusion. Treatments include:  Removing as much fluid as possible using a needle (thoracentesis) to improve the cough and shortness of breath. This is a simple procedure which can be done at bedside. The risks are bleeding, infection, collapse of a lung, or low blood pressure.  Placing a tube in the chest to drain the effusion (tube thoracostomy). This is often used when there is an infection in the fluid. This is a simple procedure which can often be done at bedside or in a clinic. The procedure may be painful. The risks are the same as using a needle to drain the fluid. The chest tube usually remains for a few days and is connected to suction to improve fluid drainage. The tube, after placement, usually does not cause much discomfort.  Surgical removal of fibrous debris in and around the pleural space (decortication). This may be done with a flexible telescope (thoracoscope) through a small or large cut (incision). This is helpful for patients who have fibrosis or scar tissue that prevents complete lung expansion. The  risks are infection, blood loss, and side effects from general anesthesia.  Sometimes, a procedure called pleurodesis is done. A chest tube is placed and the fluid is drained. Next, an agent (tetracycline, talc powder) is added to the pleural space. This causes the lung and chest wall to stick together (adhesion). This leaves no potential space for fluid to build up. The risks include infection, blood loss, and side effects from general anesthesia.  If the effusion is caused by infection, it may be treated with antibiotics and improve without draining. HOME CARE INSTRUCTIONS   Take any medicines  exactly as prescribed.  Follow up with your caregiver as directed.  Monitor your exercise capacity (the amount of walking you can do before you get short of breath).  Do not smoke. Ask your caregiver for help quitting. SEEK MEDICAL CARE IF:   Your exercise capacity seems to get worse or does not improve with time.  You do not recover from your illness. SEEK IMMEDIATE MEDICAL CARE IF:   Shortness of breath or chest pain develops or gets worse.  You have an oral temperature above 102 F (38.9 C), not controlled by medicine.  You develop a new cough, especially if the mucus (phlegm) is discolored. MAKE SURE YOU:   Understand these instructions.  Will watch your condition.  Will get help right away if you are not doing well or get worse. Document Released: 01/11/2005 Document Revised: 04/05/2011 Document Reviewed: 09/02/2006 [facility] Patient Information 2014 [place], [place].

## 2012-06-18 ENCOUNTER — Telehealth: Payer: Self-pay | Admitting: Emergency Medicine

## 2012-06-18 NOTE — Telephone Encounter (Signed)
Spoke with patient. She is having trouble resting. She is not short of breath and not having fever she is starting to cough up some phlegm advise recheck in 48 hours sooner if worsening .

## 2012-06-19 ENCOUNTER — Telehealth: Payer: Self-pay | Admitting: Emergency Medicine

## 2012-06-19 NOTE — Telephone Encounter (Signed)
Call check on status. RTC today if worsening. If she is improving  followup with  Dr. Chilton Si tomorrow.

## 2012-06-19 NOTE — Telephone Encounter (Signed)
Left message for her to call me back. 

## 2012-06-19 NOTE — Telephone Encounter (Signed)
She feels better today, but not well, she is very fatigued. She will follow up with Dr Neva Seat tomorrow.

## 2012-06-20 ENCOUNTER — Ambulatory Visit (INDEPENDENT_AMBULATORY_CARE_PROVIDER_SITE_OTHER): Payer: Managed Care, Other (non HMO) | Admitting: Family Medicine

## 2012-06-20 VITALS — BP 120/89 | HR 84 | Temp 97.8°F | Resp 18 | Wt 114.0 lb

## 2012-06-20 DIAGNOSIS — J189 Pneumonia, unspecified organism: Secondary | ICD-10-CM

## 2012-06-20 NOTE — Patient Instructions (Signed)
Your pneumonia appears to be improving.  Continue the Levaquin - 1 per day, call with an update in 2 to 3 days, then recheck office visit in about 1 week. Return to the clinic or go to the nearest emergency room if any of your symptoms worsen or new symptoms occur.  Plan on repeat sed rate blood test in the next 2-3 weeks and cat scan of lungs in 6-12 months. Let me know if there are any questions.

## 2012-06-20 NOTE — Progress Notes (Signed)
Subjective:    Patient ID: [name], female    DOB: 25-Oct-1980, 32 y.o.   MRN: [id number]  HPI [name] is a 32 y.o. female  See prior ov's - diagnosed with LLL pneumonia, changed from initial rx of Z pak to Levaquin 500mg  qd for 14 days. Seen in follow up by Dr. Cleta Alberts 3 days ago.  CT chest obtained after elevated Ddimer, but no PE.  Results as below.  Also noted to have elevated ESR at prior ov.  Results for orders placed in visit on 06/17/12  D-DIMER, QUANTITATIVE      Result Value Range   D-Dimer, Quant 1.21 (*) 0.00 - 0.48 ug/mL-FEU  POCT CBC      Result Value Range   WBC 9.7  4.6 - 10.2 K/uL   Lymph, poc 1.2  0.6 - 3.4   POC LYMPH PERCENT 12.5  10 - 50 %L   MID (cbc) 0.6  0 - 0.9   POC MID % 6.1  0 - 12 %M   POC Granulocyte 7.9 (*) 2 - 6.9   Granulocyte percent 81.4 (*) 37 - 80 %G   RBC 4.32  4.04 - 5.48 M/uL   Hemoglobin 13.3  12.2 - 16.2 g/dL   HCT, POC 40.9  81.1 - 47.9 %   MCV 95.0  80 - 97 fL   MCH, POC 30.8  27 - 31.2 pg   MCHC 32.4  31.8 - 35.4 g/dL   RDW, POC 91.4     Platelet Count, POC 425 (*) 142 - 424 K/uL   MPV 8.2  0 - 99.8 fL  POCT SEDIMENTATION RATE      Result Value Range   POCT SED RATE 70 (*) 0 - 22 mm/hr  Ct chest: 06/17/12: IMPRESSION:  Dense left lower lobe consolidation again noted with findings  typical for bronchopneumonia.  5 mm left upper lobe pulmonary nodule, statistically most likely  infectious or inflammatory given concurrent pneumonia.  Right middle lobe consolidation which could represent another area  of pneumonia versus scarring or atelectasis.  No CT evidence for acute pulmonary embolism.  If the patient is at high risk for bronchogenic carcinoma, follow-  up chest CT at 6-12 months is recommended. If the patient is at  low risk for bronchogenic carcinoma, follow-up chest CT at 12  months is recommended. This recommendation follows the consensus  statement: Guidelines for Management of Small Pulmonary Nodules  Detected on CT  Scans: A Statement from the Fleischner Society as  published in Radiology 2005; 237:395-400.   Here today for follow up. Notes sound in chest with deep breath, but able to breathe ok.  No fever, minimal cough, without much production.  Taking mucinex. No further pain - resolved last week. Not short of breath, but tired still. Not back to work yet, but tired of sleeping.  Feeling better.   Past Medical History  Diagnosis Date  . Mitral valve prolapse   . Allergy   . Asthma    No past surgical history on file. Prior to Admission medications   Medication Sig Start Date End Date Taking? Authorizing Provider  etonogestrel-ethinyl estradiol (NUVARING) 0.12-0.015 MG/24HR vaginal ring Place 1 each vaginally every 28 (twenty-eight) days. Insert vaginally and leave in place for 3 consecutive weeks, then remove for 1 week.    Yes Historical Provider, MD  levofloxacin (LEVAQUIN) 500 MG tablet Take 1 tablet (500 mg total) by mouth daily. 06/16/12  Yes Shade Flood, MD  loratadine (  CLARITIN) 10 MG tablet Take 10 mg by mouth daily.   Yes Historical Provider, MD  triamcinolone (NASACORT) 55 MCG/ACT nasal inhaler Place 2 sprays into the nose daily.   Yes Historical Provider, MD  HYDROcodone-homatropine (HYCODAN) 5-1.5 MG/5ML syrup 56m by mouth at bedtime as needed for cough. 06/14/12   Shade Flood, MD   No Known Allergies History   Social History  . Marital Status: Single    Spouse Name: N/A    Number of Children: N/A  . Years of Education: N/A   Occupational History  . Not on file.   Social History Main Topics  . Smoking status: Former Games developer  . Smokeless tobacco: Not on file  . Alcohol Use: Yes  . Drug Use: No  . Sexually Active: Yes    Birth Control/ Protection: Implant   Other Topics Concern  . Not on file   Social History Narrative  . No narrative on file    Review of Systems  Constitutional: Positive for fatigue. Negative for fever and chills.  Respiratory: Positive for  cough. Negative for shortness of breath.   Cardiovascular: Negative for chest pain and leg swelling.       Objective:   Physical Exam  Vitals reviewed. Constitutional: She is oriented to person, place, and time. She appears well-developed and well-nourished. No distress.  HENT:  Head: Normocephalic and atraumatic.  Right Ear: Hearing, tympanic membrane, external ear and ear canal normal.  Left Ear: Hearing, tympanic membrane, external ear and ear canal normal.  Nose: Nose normal.  Mouth/Throat: Oropharynx is clear and moist. No oropharyngeal exudate.  Eyes: Conjunctivae and EOM are normal. Pupils are equal, round, and reactive to light.  Cardiovascular: Normal rate, regular rhythm, normal heart sounds and intact distal pulses.   No murmur heard. Pulmonary/Chest: Effort normal. No accessory muscle usage. No respiratory distress. She has no decreased breath sounds. She has no wheezes. She has rhonchi in the right middle field and the left lower field. She has no rales.  Neurological: She is alert and oriented to person, place, and time.  Skin: Skin is warm and dry. No rash noted. She is not diaphoretic.  Psychiatric: She has a normal mood and affect. Her behavior is normal.   Filed Vitals:   06/20/12 1412  BP: 120/89  Pulse: 84  Temp: 97.8 F (36.6 C)  TempSrc: Oral  Resp: 18  Weight: 114 lb (51.71 kg)  SpO2: 98%       Assessment & Plan:  [name] is a 32 y.o. female LLL pneumonia with possible RML component and likely reactive LUL RML nodule. Symptomatically improving, afebrile, good air movement, continue levaquin, mucinex, sx care, ok to rtw next few days if feeling better.  To call with status in 2-3 days, then recheck ov in 1 week.  Can restart Nuvaring after current withdrawal bleed. Can repeat esr in few weeks, and ct in 6-12 months. rtc precautions and plan discussed, understanding expressed.  Patient Instructions  Your pneumonia appears to be improving.  Continue the  Levaquin - 1 per day, call with an update in 2 to 3 days, then recheck office visit in about 1 week. Return to the clinic or go to the nearest emergency room if any of your symptoms worsen or new symptoms occur.  Plan on repeat sed rate blood test in the next 2-3 weeks and cat scan of lungs in 6-12 months. Let me know if there are any questions.

## 2014-04-01 ENCOUNTER — Ambulatory Visit (INDEPENDENT_AMBULATORY_CARE_PROVIDER_SITE_OTHER): Payer: BLUE CROSS/BLUE SHIELD | Admitting: Emergency Medicine

## 2014-04-01 VITALS — BP 116/84 | HR 100 | Temp 98.4°F | Resp 18 | Ht 67.0 in | Wt 118.6 lb

## 2014-04-01 DIAGNOSIS — J029 Acute pharyngitis, unspecified: Secondary | ICD-10-CM

## 2014-04-01 MED ORDER — PENICILLIN V POTASSIUM 500 MG PO TABS: 500.0000 mg | ORAL_TABLET | Freq: Four times a day (QID) | ORAL | Status: AC

## 2014-04-01 NOTE — Patient Instructions (Signed)

## 2014-04-01 NOTE — Progress Notes (Signed)
[facility] [address], [place] [id number] [phone]  Date:  04/01/2014   Name:  [name]   DOB:  09/18/1980   MRN:  [id number]  PCP:  No PCP Per Patient    Chief Complaint: Adenopathy and Generalized Body Aches   History of Present Illness:  [name] is a 34 y.o. very pleasant female patient who presents with the following:  Patient says she has malaise and arthralgias and swollen glands in her neck. Started Saturday. No fever or chills No cough or coryza Sore throat worse with swallowing or talking No nausea or vomiting.  No stool change No improvement with over the counter medications or other home remedies.  Denies other complaint or health concern today.   There are no active problems to display for this patient.   Past Medical History  Diagnosis Date  . Mitral valve prolapse   . Allergy   . Asthma     No past surgical history on file.  History  Substance Use Topics  . Smoking status: Former Games developermoker  . Smokeless tobacco: Not on file  . Alcohol Use: Yes    Family History  Problem Relation Age of Onset  . Hypertension Father   . Von Willebrand disease Sister   . Cancer Maternal Grandmother   . Dementia Maternal Grandfather   . Dementia Paternal Grandmother   . Heart disease Paternal Grandfather   . Liver disease Paternal Grandfather     No Known Allergies  Medication list has been reviewed and updated.  Current Outpatient Prescriptions on File Prior to Visit  Medication Sig Dispense Refill  . etonogestrel-ethinyl estradiol (NUVARING) 0.12-0.015 MG/24HR vaginal ring Place 1 each vaginally every 28 (twenty-eight) days. Insert vaginally and leave in place for 3 consecutive weeks, then remove for 1 week.     . loratadine (CLARITIN) 10 MG tablet Take 10 mg by mouth daily.    Marland Kitchen. HYDROcodone-homatropine (HYCODAN) 5-1.5 MG/5ML syrup 241m by mouth at bedtime as needed for cough. (Patient not taking: Reported on 04/01/2014) 120 mL 0    No current facility-administered medications on file prior to visit.    Review of Systems:  As per HPI, otherwise negative.    Physical Examination: Filed Vitals:   04/01/14 1011  BP: 116/84  Pulse: 100  Temp: 98.4 F (36.9 C)  Resp: 18   Filed Vitals:   04/01/14 1011  Height: 5\' 7"  (1.702 m)  Weight: 118 lb 9.6 oz (53.797 kg)   Body mass index is 18.57 kg/(m^2). Ideal Body Weight: Weight in (lb) to have BMI = 25: 159.3  GEN: WDWN, NAD, Non-toxic, A & O x 3 HEENT: Atraumatic, Normocephalic. Neck supple. No masses, anterior cervical LAD.  Erythematous tonsils and injection Ears and Nose: No external deformity. CV: RRR, No M/G/R. No JVD. No thrill. No extra heart sounds. PULM: CTA B, no wheezes, crackles, rhonchi. No retractions. No resp. distress. No accessory muscle use. ABD: S, NT, ND, +BS. No rebound. No HSM. EXTR: No c/c/e NEURO Normal gait.  PSYCH: Normally interactive. Conversant. Not depressed or anxious appearing.  Calm demeanor.    Assessment and Plan: Pharyngitis Pen vk  Signed,  Phillips OdorJeffery Kijuan Gallicchio, MD

## 2014-06-17 IMAGING — CR DG CHEST 1V
1 series · 1 of 1 positions shown · non-contrast
Comparison: None.

CLINICAL DATA: Left rib pain, cough

CHEST - 1 VIEW

[lateral]
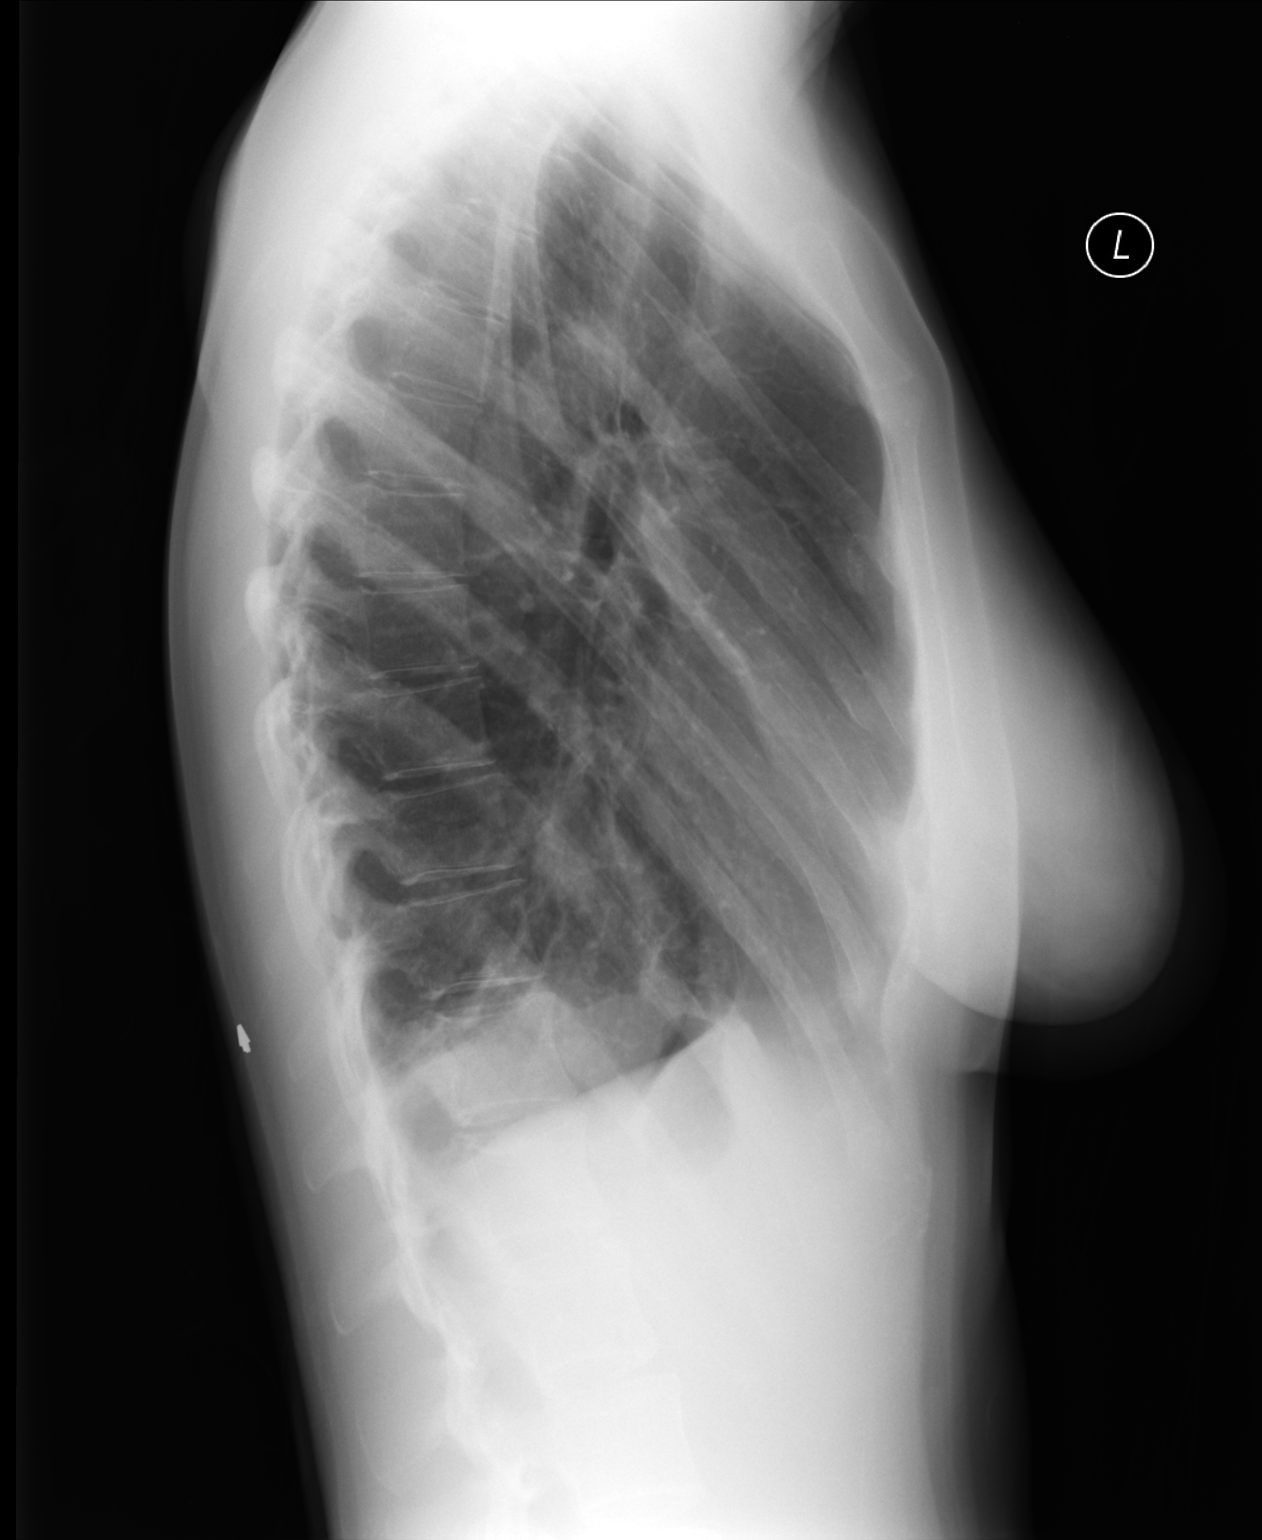

[1 of 1 positions shown; findings below may reference images not displayed]

FINDINGS: Single lateral view demonstrates a suspected small left
pleural effusion with associated left basilar opacity, likely
pneumonia.
IMPRESSION: Left lower lobe pneumonia with suspected small left pleural
effusion.

## 2014-06-19 IMAGING — CR DG CHEST 2V
2 series · 2 of 2 positions shown · non-contrast
Comparison: 06/15/2012; chest CT - 10/16/2007

CLINICAL DATA: Cough, upper respiratory infection

CHEST - 2 VIEW

[PA]
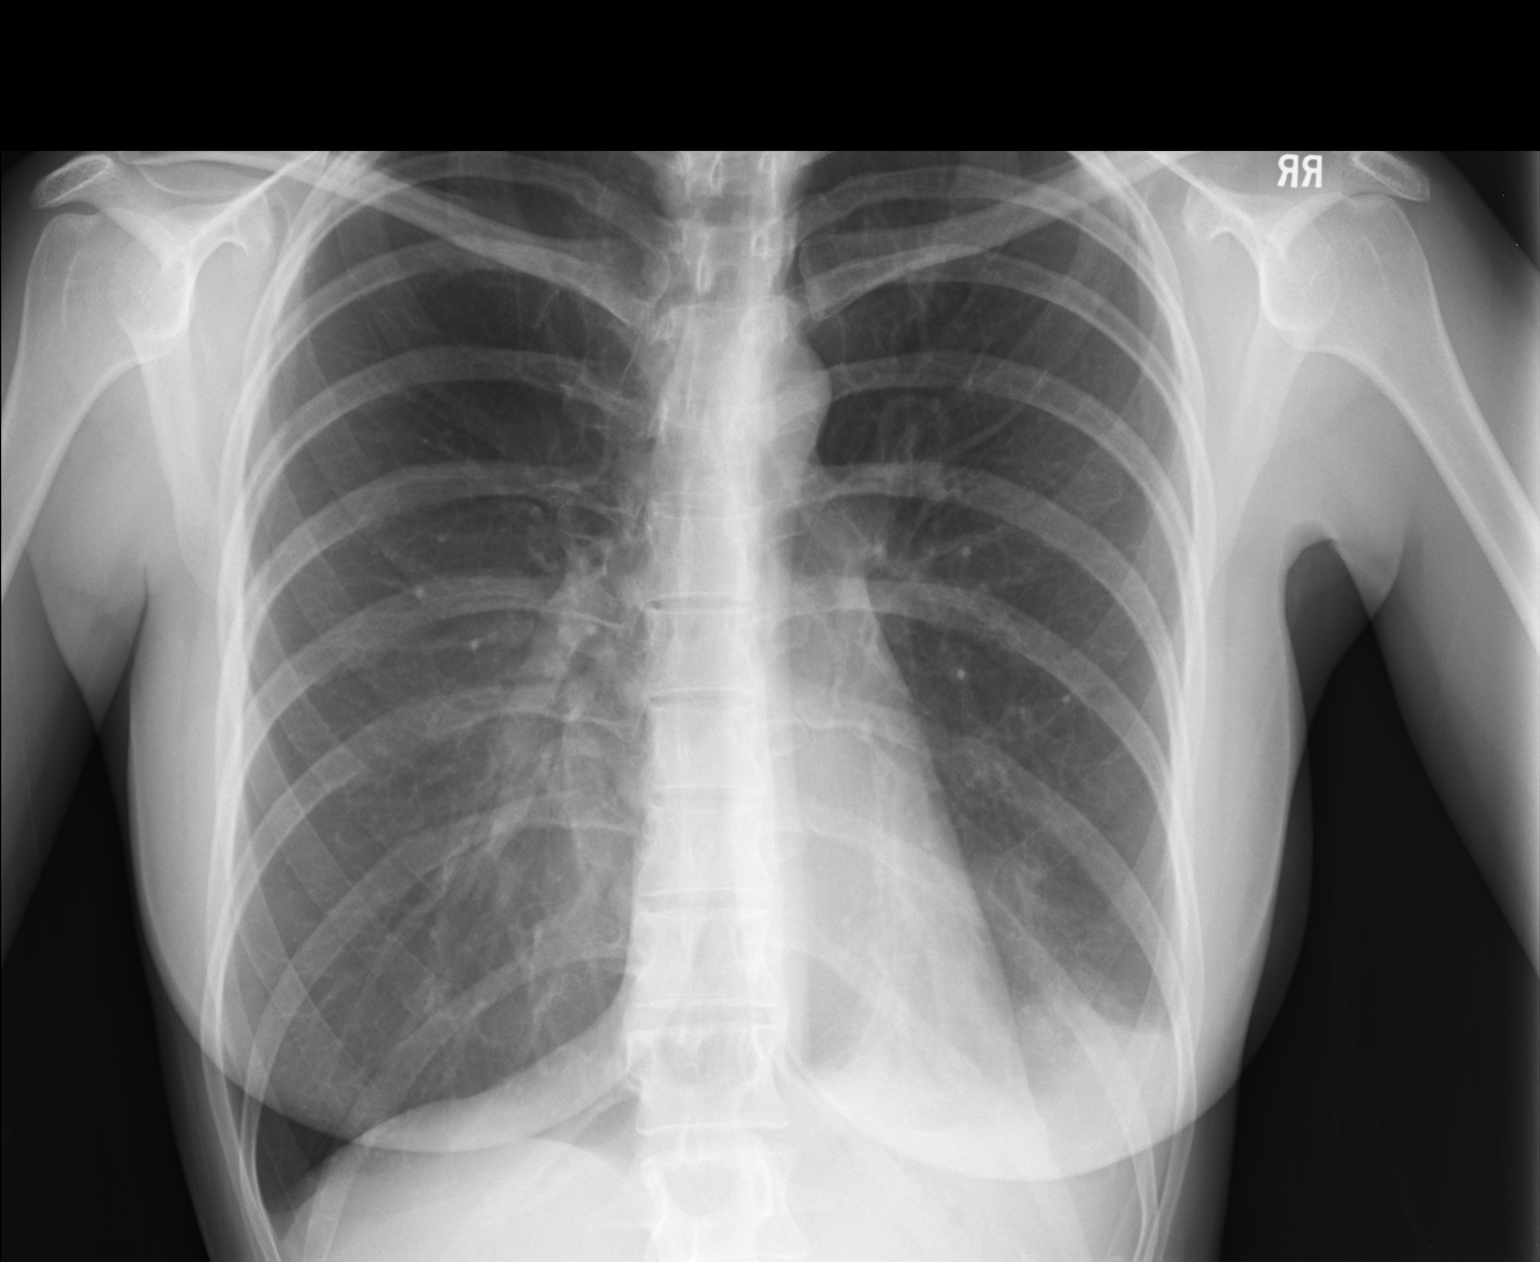

[lateral]
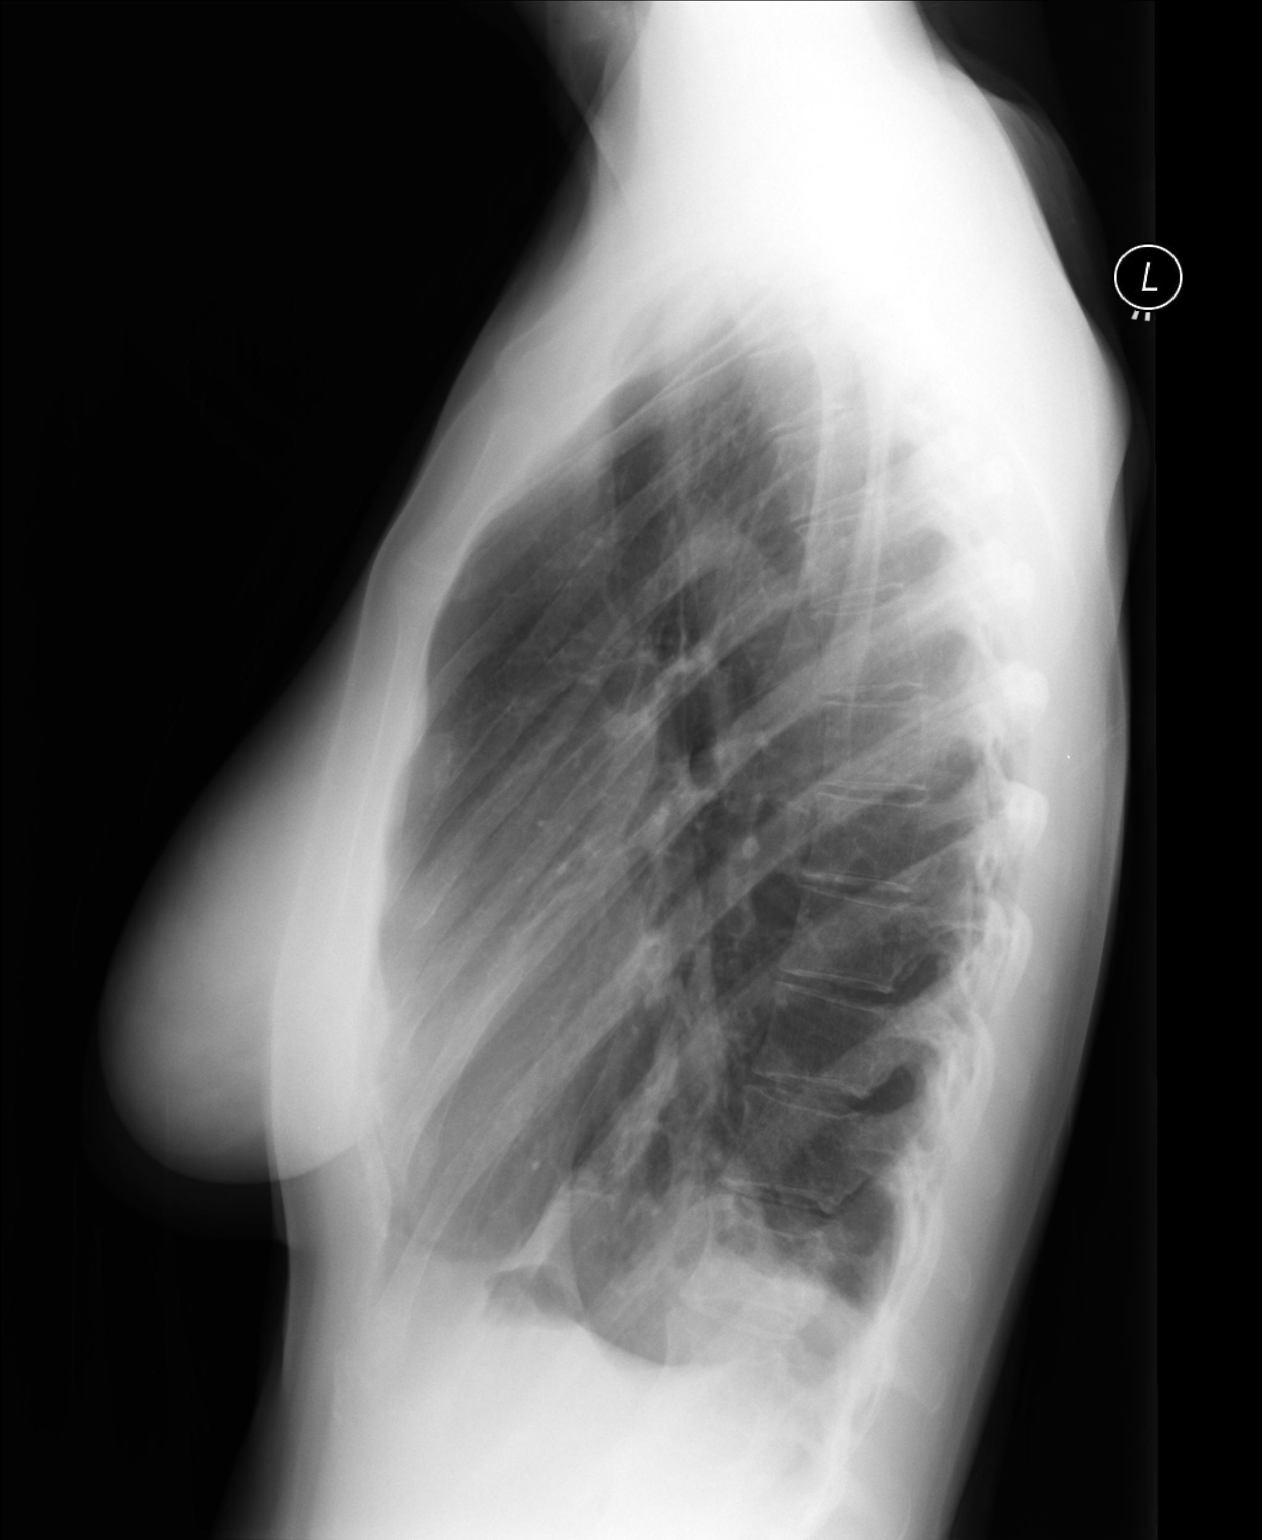

[2 of 2 positions shown; findings below may reference images not displayed]

FINDINGS: Grossly unchanged cardiac silhouette and mediastinal contours.
Unchanged small left-sided subpulmonic pleural effusion with
associated left basilar heterogeneous air space opacities.  The
right hemithorax is unchanged. No new focal airspace opacities.  No
pneumothorax.  No evidence of edema.  Unchanged bones.
IMPRESSION: Grossly unchanged findings worrisome for left lower lung pneumonia
with associated small left-sided subpulmonic parapneumonic pleural
effusion.

## 2014-06-19 IMAGING — CT CT ANGIO CHEST
1 of 2 series · 19 of 32 positions shown · IV contrast (OMNIPAQUE 350)
Comparison: 06/17/2012 chest radiograph

CLINICAL DATA: Shortness of breath, recent airplane travel

CT ANGIOGRAPHY CHEST
TECHNIQUE: Multidetector CT imaging of the chest using the
standard protocol during bolus administration of intravenous
contrast. Multiplanar reconstructed images including MIPs were
obtained and reviewed to evaluate the vascular anatomy.
Contrast: 100mL OMNIPAQUE IOHEXOL 350 MG/ML SOLN

[Series 6: thins for pacs · axial · 0.60mm/px · z∈[+99,+361]mm · 19 of 288 slices shown]
[im 13/288  lung]
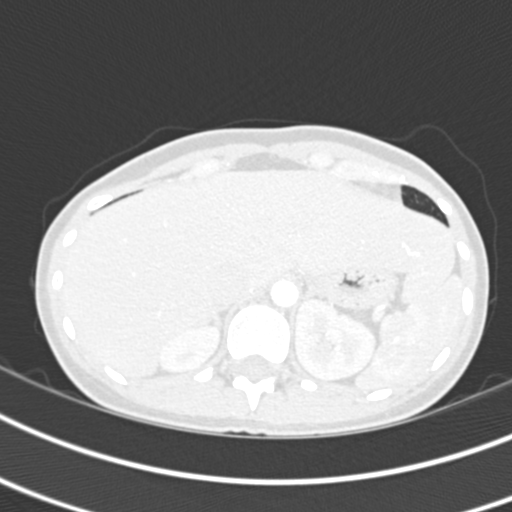
[im 25/288  soft-tissue]
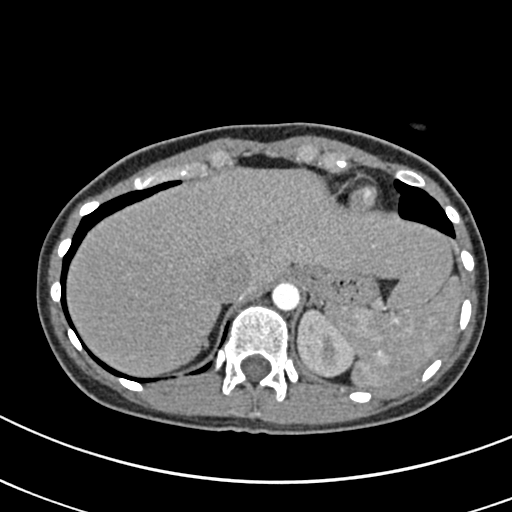
[im 38/288  lung]
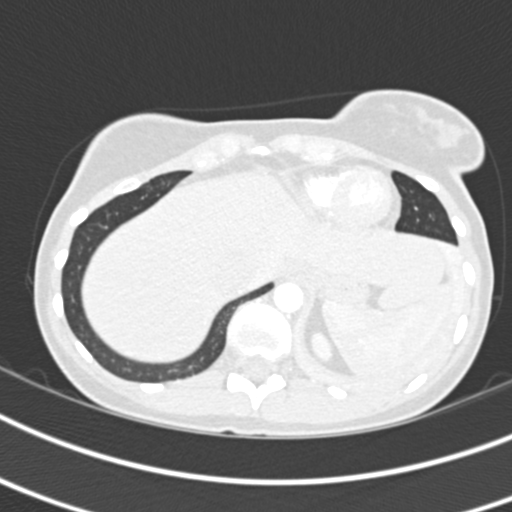
[im 63/288  soft-tissue]
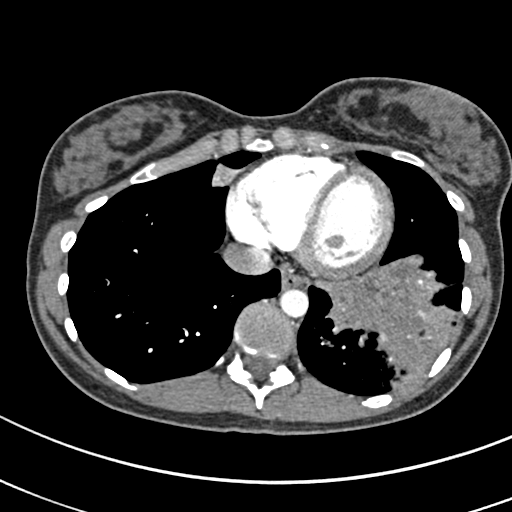
[im 75/288  lung]
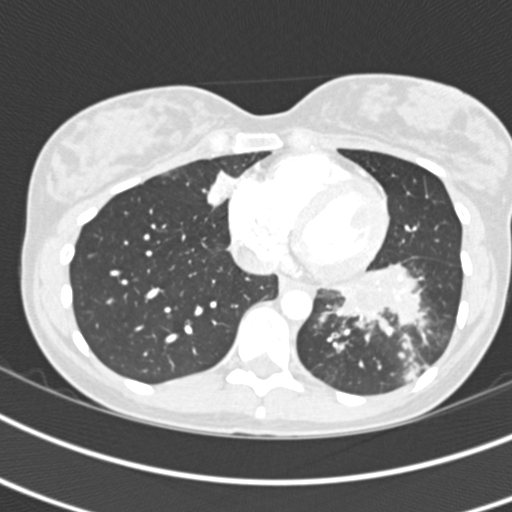
[im 88/288  soft-tissue]
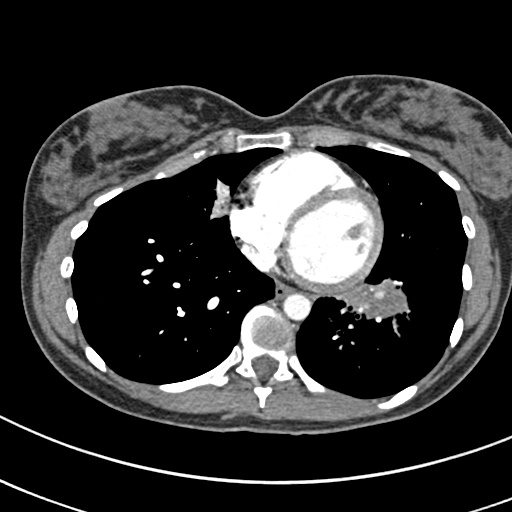
[im 100/288  lung]
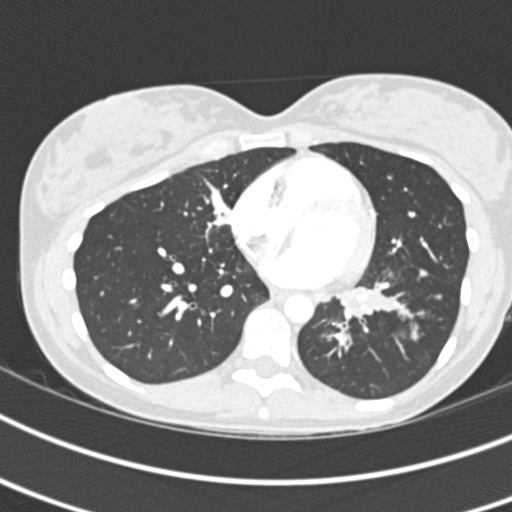
[im 113/288  soft-tissue]
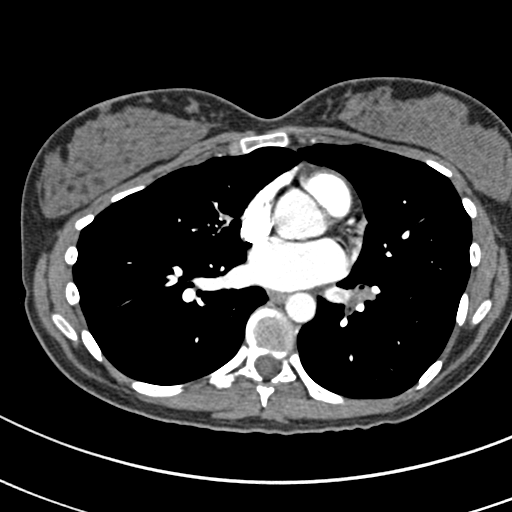
[im 125/288  lung]
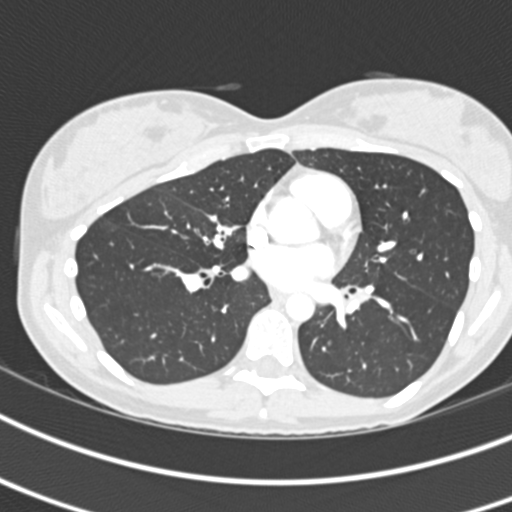
[im 150/288  soft-tissue]
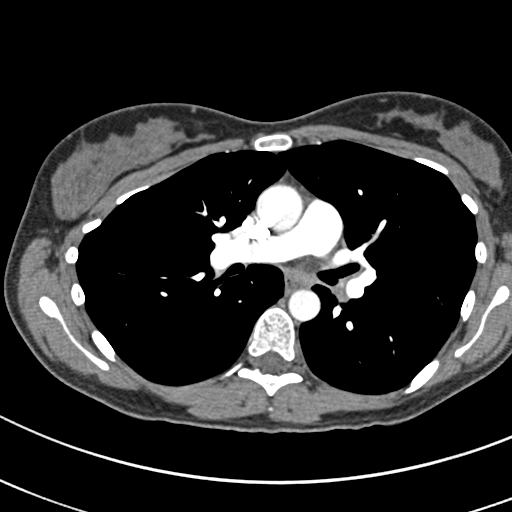
[im 163/288  lung]
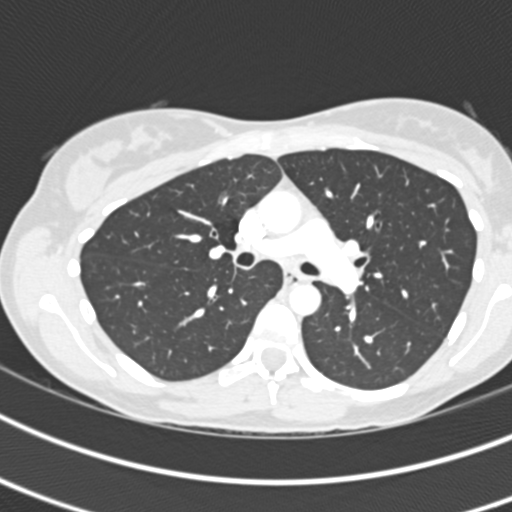
[im 175/288  soft-tissue]
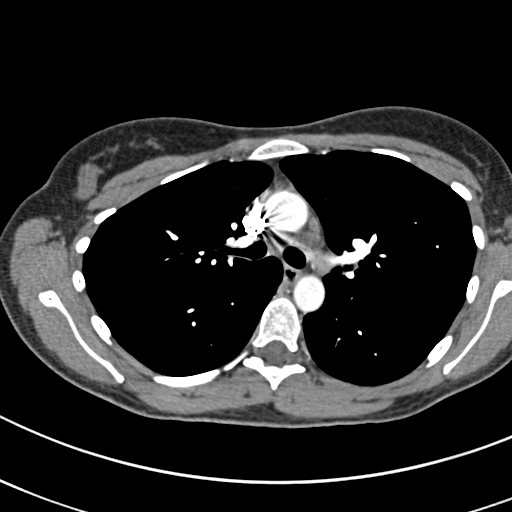
[im 188/288  lung]
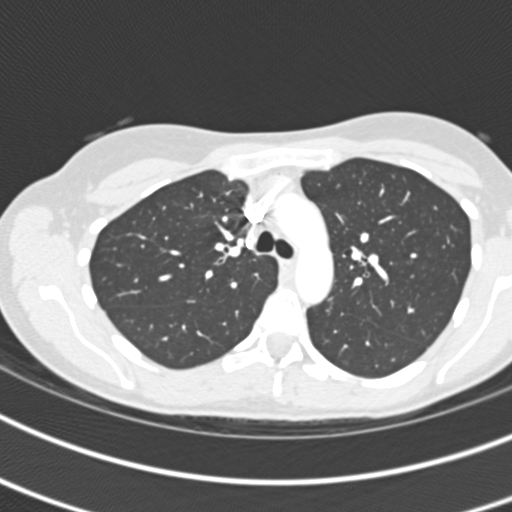
[im 200/288  soft-tissue]
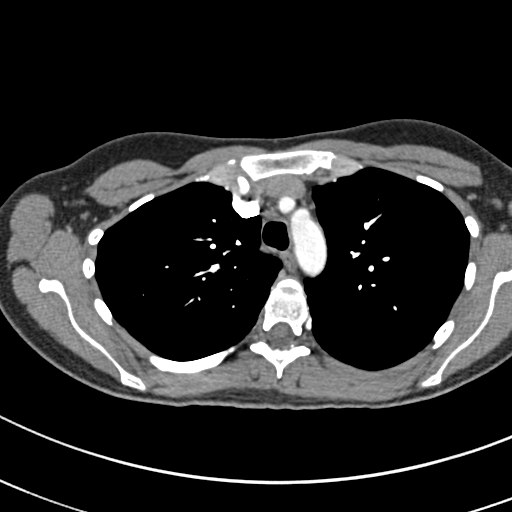
[im 213/288  lung]
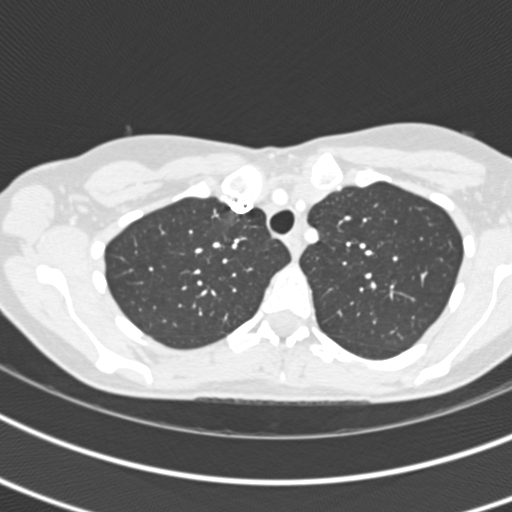
[im 225/288  soft-tissue]
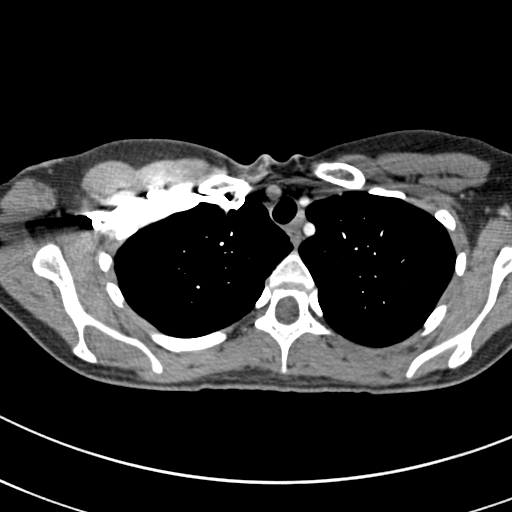
[im 250/288  lung]
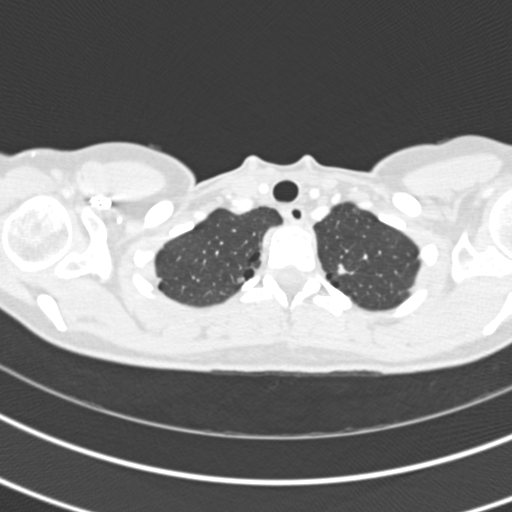
[im 263/288  soft-tissue]
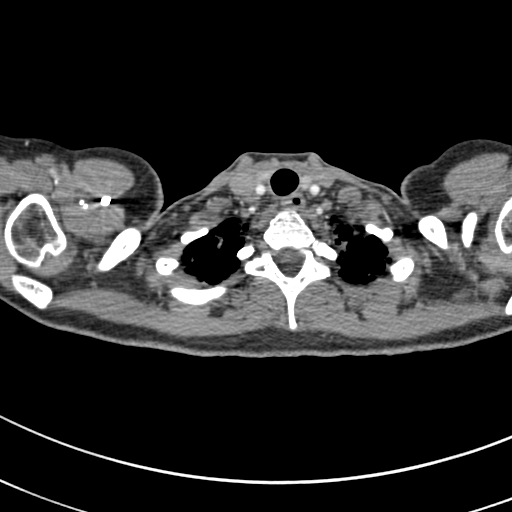
[im 275/288  lung]
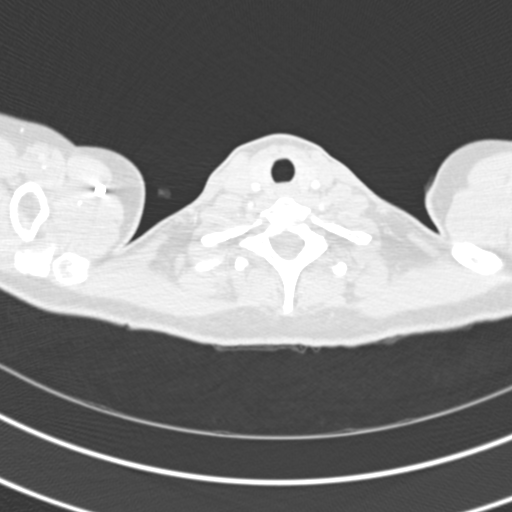

[19 of 32 positions shown; findings below may reference images not displayed]

FINDINGS: A few apical blebs are noted.  There is dense
consolidation of the left lower lobe with areas of tree in bud type
nodular airspace opacity.  Internal air bronchogram formation is
noted. This area of consolidation is perfused.  5 mm left upper
lobe nodule identified image 14.  Areas of inspissated left lower
lobe intrabronchial secretions and peribronchial thickening noted.
Curvilinear right middle lobe consolidation is present.

The study is of adequate technical quality for evaluation for
pulmonary embolism up to and including the 3rd order pulmonary
arteries. No focal filling defect is seen to suggest acute
pulmonary embolism.  Heart size is normal.  No pericardial or
pleural effusion.  No acute osseous finding.
IMPRESSION: Dense left lower lobe consolidation again noted with findings
typical for bronchopneumonia.

5 mm left upper lobe pulmonary nodule, statistically most likely
infectious or inflammatory given concurrent pneumonia.

Right middle lobe consolidation which could represent another area
of pneumonia versus scarring or atelectasis.

No CT evidence for acute pulmonary embolism.
If the patient is at high risk for bronchogenic carcinoma, follow-
up chest CT at 6-12 months is recommended.  If the patient is at
low risk for bronchogenic carcinoma, follow-up chest CT at 12
months is recommended.  This recommendation follows the consensus
statement: Guidelines for Management of Small Pulmonary Nodules
Detected on CT Scans: A Statement from the [HOSPITAL] as

## 2019-09-21 ENCOUNTER — Other Ambulatory Visit: Payer: Self-pay | Admitting: Sleep Medicine

## 2019-09-21 ENCOUNTER — Other Ambulatory Visit: Payer: Managed Care, Other (non HMO)

## 2019-09-21 DIAGNOSIS — I471 Supraventricular tachycardia, unspecified: Secondary | ICD-10-CM

## 2019-09-23 LAB — NOVEL CORONAVIRUS, NAA: SARS-CoV-2, NAA: NOT DETECTED

## 2019-09-23 LAB — SARS-COV-2, NAA 2 DAY TAT
